# Patient Record
Sex: Female | Born: 1995 | Race: Black or African American | Hispanic: No | Marital: Single | Smoking: Current every day smoker
Health system: Southern US, Community
[De-identification: ages and names within clinical notes are randomized; demographics above are authoritative.]

## PROBLEM LIST (undated history)

## (undated) DIAGNOSIS — J45909 Unspecified asthma, uncomplicated: Secondary | ICD-10-CM

## (undated) HISTORY — PX: WISDOM TOOTH EXTRACTION: SHX21

---
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
## 2017-12-23 ENCOUNTER — Encounter (HOSPITAL_COMMUNITY): Payer: Self-pay | Admitting: Emergency Medicine

## 2017-12-23 ENCOUNTER — Ambulatory Visit (HOSPITAL_COMMUNITY)
Admission: EM | Admit: 2017-12-23 | Discharge: 2017-12-23 | Disposition: A | Discharge status: Home / Self Care | Payer: Commercial Managed Care - PPO | Attending: Family Medicine | Admitting: Family Medicine

## 2017-12-23 DIAGNOSIS — J4521 Mild intermittent asthma with (acute) exacerbation: Secondary | ICD-10-CM

## 2017-12-23 MED ORDER — IPRATROPIUM-ALBUTEROL 0.5-2.5 (3) MG/3ML IN SOLN
RESPIRATORY_TRACT | Status: AC
  Filled 2017-12-23: qty 3

## 2017-12-23 MED ORDER — IPRATROPIUM-ALBUTEROL 0.5-2.5 (3) MG/3ML IN SOLN
3.0000 mL | Freq: Once | RESPIRATORY_TRACT | Status: AC
  Administered 2017-12-23: 3 mL via RESPIRATORY_TRACT

## 2017-12-23 MED ORDER — PREDNISONE 10 MG PO TABS: 20.0000 mg | ORAL_TABLET | Freq: Two times a day (BID) | ORAL | 0 refills | Status: AC

## 2017-12-23 MED ORDER — ALBUTEROL SULFATE HFA 108 (90 BASE) MCG/ACT IN AERS: 1.0000 | INHALATION_SPRAY | Freq: Four times a day (QID) | RESPIRATORY_TRACT | 0 refills | Status: DC | PRN

## 2017-12-23 NOTE — ED Triage Notes (Signed)
Pt sts increased asthma sx over last 3 days

## 2017-12-23 NOTE — Discharge Instructions (Addendum)
Duoneb given in office Proair inhaler prescribed.  Use as directed Take steroid as prescribed and to completion Begin taking OTC antihistamine or allergy medication for allergy symptoms Follow up with PCP next week Return here or go to ER if you have any new or worsening symptoms such as shortness of breath, difficulty breathing, accessory muscle use, rib retraction, or if symptoms do not improve with medication

## 2017-12-23 NOTE — ED Provider Notes (Addendum)
[facility]    CSN: [id number] Arrival date & time: 12/23/17  1414     History   Chief Complaint Chief Complaint  Patient presents with  . Asthma    HPI Briana [name] is a 22 y.o. female with hx of asthma complaining of trouble breathing for the past 3 days.  She denies a precipitating event, but attributes it to seasonal allergies.  She has tried her albuterol inhaler without relief.  Her symptoms are made worse with cold air.  She reports similar symptoms in the past that resolved with inhaler use.    HPI  Past Medical History:  Diagnosis Date  . Asthma     There are no active problems to display for this patient.   History reviewed. No pertinent surgical history.  OB History   None      Home Medications    Prior to Admission medications   Medication Sig Start Date End Date Taking? Authorizing Provider  albuterol (PROVENTIL HFA;VENTOLIN HFA) 108 (90 Base) MCG/ACT inhaler Inhale 1-2 puffs into the lungs every 6 (six) hours as needed for wheezing or shortness of breath. 12/23/17   [name], [place], PA-C  predniSONE (DELTASONE) 10 MG tablet Take 2 tablets (20 mg total) by mouth 2 (two) times daily for 5 days. 12/23/17 12/28/17  [name], PA-C    Family History History reviewed. No pertinent family history.  Social History Social History   Tobacco Use  . Smoking status: Never Smoker  . Smokeless tobacco: Never Used  Substance Use Topics  . Alcohol use: No  . Drug use: No     Allergies   Patient has no known allergies.   Review of Systems Review of Systems  Constitutional: Negative for chills, fatigue and fever.  HENT: Positive for congestion, sneezing and sore throat. Negative for rhinorrhea, sinus pressure and sinus pain.   Respiratory: Positive for cough, shortness of breath and wheezing. Negative for stridor.   Cardiovascular: Negative for chest pain.  Gastrointestinal: Negative for abdominal pain, constipation and diarrhea.      Physical Exam Triage Vital Signs ED Triage Vitals  Enc Vitals Group     BP      Pulse      Resp      Temp      Temp src      SpO2      Weight      Height      Head Circumference      Peak Flow      Pain Score      Pain Loc      Pain Edu?      Excl. in GC?    No data found.  Updated Vital Signs BP (!) 143/93 (BP Location: Right Arm)   Pulse 85   Temp 97.8 F (36.6 C) (Oral)   Resp 18   SpO2 99%   Visual Acuity Right Eye Distance:   Left Eye Distance:   Bilateral Distance:    Right Eye Near:   Left Eye Near:    Bilateral Near:     Physical Exam  Constitutional: She is oriented to person, place, and time. She appears well-developed and well-nourished. No distress.  HENT:  Head: Normocephalic and atraumatic.  Right Ear: External ear normal.  Left Ear: External ear normal.  Nose: Nose normal.  Mouth/Throat: Oropharynx is clear and moist.  Eyes: Pupils are equal, round, and reactive to light. EOM are normal.  Neck: Neck supple.  Pulmonary/Chest: Effort  normal and breath sounds normal. No respiratory distress. She has no wheezes. She has no rales.  Lymphadenopathy:    She has no cervical adenopathy.  Neurological: She is alert and oriented to person, place, and time.  Skin: Skin is warm and dry. She is not diaphoretic.  Psychiatric: She has a normal mood and affect. Her behavior is normal. Judgment and thought content normal.     UC Treatments / Results  Labs (all labs ordered are listed, but only abnormal results are displayed) Labs Reviewed - No data to display  EKG None Radiology No results found.  Procedures Procedures (including critical care time)  Medications Ordered in UC Medications  ipratropium-albuterol (DUONEB) 0.5-2.5 (3) MG/3ML nebulizer solution 3 mL (3 mLs Nebulization Given 12/23/17 1504)     Initial Impression / Assessment and Plan / UC Course  I have reviewed the triage vital signs and the nursing notes.  Pertinent labs  & imaging results that were available during my care of the patient were reviewed by me and considered in my medical decision making (see chart for details).     Patient symptoms and physical exam findings consistent with asthma exacerbation.  Given duoneb in office.  Proair inhaler refilled.  Steroid prescribed.  Patient instructed to follow up with PCP next week.  ER precautions reviewed with patient.  Final Clinical Impressions(s) / UC Diagnoses   Final diagnoses:  Mild intermittent asthma with exacerbation    ED Discharge Orders        Ordered    predniSONE (DELTASONE) 10 MG tablet  2 times daily     12/23/17 1502    albuterol (PROVENTIL HFA;VENTOLIN HFA) 108 (90 Base) MCG/ACT inhaler  Every 6 hours PRN     12/23/17 1502       Controlled Substance Prescriptions [place] Controlled Substance Registry consulted? Not Applicable   Rennis HardingWurst, Bridgitt Raggio, PA-C 12/23/17 1511    Alvino ChapelWurst, [place], [place] JerseyPA-C 12/28/17 2024

## 2018-09-14 ENCOUNTER — Ambulatory Visit: Payer: Commercial Managed Care - PPO | Admitting: Medical

## 2018-09-14 DIAGNOSIS — Z0289 Encounter for other administrative examinations: Secondary | ICD-10-CM

## 2019-09-10 ENCOUNTER — Other Ambulatory Visit: Payer: Self-pay

## 2019-09-10 ENCOUNTER — Encounter (HOSPITAL_COMMUNITY): Payer: Self-pay | Admitting: Emergency Medicine

## 2019-09-10 ENCOUNTER — Emergency Department (HOSPITAL_COMMUNITY)
Admission: EM | Admit: 2019-09-10 | Discharge: 2019-09-10 | Disposition: A | Discharge status: Home / Self Care | Payer: Commercial Managed Care - PPO | Attending: Emergency Medicine | Admitting: Emergency Medicine

## 2019-09-10 DIAGNOSIS — M545 Low back pain: Secondary | ICD-10-CM | POA: Insufficient documentation

## 2019-09-10 DIAGNOSIS — Z5321 Procedure and treatment not carried out due to patient leaving prior to being seen by health care provider: Secondary | ICD-10-CM | POA: Insufficient documentation

## 2019-09-10 NOTE — ED Triage Notes (Signed)
Pt reports lower back pain without any injury. Pt reports taking Motrin and Bengay without relief.

## 2019-09-10 NOTE — ED Notes (Signed)
Pt reported to registration that her back is feeling better and she is going home.

## 2020-04-20 ENCOUNTER — Other Ambulatory Visit: Payer: Self-pay

## 2020-04-20 ENCOUNTER — Ambulatory Visit (HOSPITAL_COMMUNITY)
Admission: EM | Admit: 2020-04-20 | Discharge: 2020-04-20 | Disposition: A | Discharge status: Home / Self Care | Payer: Commercial Managed Care - PPO | Attending: Family Medicine | Admitting: Family Medicine

## 2020-04-20 ENCOUNTER — Encounter (HOSPITAL_COMMUNITY): Payer: Self-pay | Admitting: *Deleted

## 2020-04-20 ENCOUNTER — Encounter (HOSPITAL_COMMUNITY): Payer: Self-pay

## 2020-04-20 ENCOUNTER — Emergency Department (HOSPITAL_COMMUNITY)
Admission: EM | Admit: 2020-04-20 | Discharge: 2020-04-20 | Disposition: A | Discharge status: Home / Self Care | Payer: Commercial Managed Care - PPO | Attending: Emergency Medicine | Admitting: Emergency Medicine

## 2020-04-20 VITALS — BP 125/63 | HR 90 | Temp 98.9°F | Resp 18

## 2020-04-20 DIAGNOSIS — N611 Abscess of the breast and nipple: Secondary | ICD-10-CM | POA: Diagnosis present

## 2020-04-20 DIAGNOSIS — N644 Mastodynia: Secondary | ICD-10-CM

## 2020-04-20 DIAGNOSIS — B9689 Other specified bacterial agents as the cause of diseases classified elsewhere: Secondary | ICD-10-CM

## 2020-04-20 DIAGNOSIS — Z5321 Procedure and treatment not carried out due to patient leaving prior to being seen by health care provider: Secondary | ICD-10-CM | POA: Diagnosis not present

## 2020-04-20 DIAGNOSIS — L089 Local infection of the skin and subcutaneous tissue, unspecified: Secondary | ICD-10-CM

## 2020-04-20 MED ORDER — DOXYCYCLINE HYCLATE 100 MG PO CAPS: 100.0000 mg | ORAL_CAPSULE | Freq: Two times a day (BID) | ORAL | 0 refills | Status: DC

## 2020-04-20 NOTE — ED Triage Notes (Signed)
Pt reports having tenderness and a lump in the right breast since yesterday. Denies fever, chills. Denies discharge or swelling.

## 2020-04-20 NOTE — ED Triage Notes (Signed)
Pt reports abscess on right breast x 3 days. Denies fever. No redness or swelling noted.

## 2020-04-20 NOTE — ED Notes (Signed)
Called for room X3 with no answer 

## 2020-04-21 NOTE — ED Provider Notes (Signed)
  [facility]   [id number] 04/20/20 Arrival Time: 1859  ASSESSMENT & PLAN:  1. Bacterial skin infection     Prefers trial of antibiotic before attempting I&D.  Begin: Meds ordered this encounter  Medications  . doxycycline (VIBRAMYCIN) 100 MG capsule    Sig: Take 1 capsule (100 mg total) by mouth 2 (two) times daily.    Dispense:  20 capsule    Refill:  0     Follow-up Information    [facility].   Specialty: Urgent Care Why: If worsening or failing to improve as anticipated. Contact information: [address] [place] [address] [phone]              Reviewed expectations re: course of current medical issues. Questions answered. Outlined signs and symptoms indicating need for more acute intervention. Patient verbalized understanding. After Visit Summary given.   SUBJECTIVE:  Briana [name] is a 24 y.o. female who presents with a possible infection of R breast; noticed area of slight redness and swelling 2-3 d ago. No discharge or drainage. Afebrile. No OTC tx. Without nipple d/c.  OBJECTIVE:  Vitals:   04/20/20 1919  BP: 125/63  Pulse: 90  Resp: 18  Temp: 98.9 F (37.2 C)  TempSrc: Oral  SpO2: 100%     General appearance: alert; no distress Skin: slightly erythematous area of inferior R breast; mild skin thickening; no fluctuance; no drainage or bleeding (chaperone present for exam) Psychological: alert and cooperative; normal mood and affect  No Known Allergies  Past Medical History:  Diagnosis Date  . Asthma    Social History   Socioeconomic History  . Marital status: Single    Spouse name: Not on file  . Number of children: Not on file  . Years of education: Not on file  . Highest education level: Not on file  Occupational History  . Not on file  Tobacco Use  . Smoking status: Current Every Day Smoker    Packs/day: 0.33    Types: Cigarettes  . Smokeless tobacco: Never Used   Substance and Sexual Activity  . Alcohol use: Yes  . Drug use: No  . Sexual activity: Not on file  Other Topics Concern  . Not on file  Social History Narrative  . Not on file   Social Determinants of Health   Financial Resource Strain:   . Difficulty of Paying Living Expenses:   Food Insecurity:   . Worried About Programme researcher, broadcasting/film/video in the Last Year:   . Barista in the Last Year:   Transportation Needs:   . Freight forwarder (Medical):   Marland Kitchen Lack of Transportation (Non-Medical):   Physical Activity:   . Days of Exercise per Week:   . Minutes of Exercise per Session:   Stress:   . Feeling of Stress :   Social Connections:   . Frequency of Communication with Friends and Family:   . Frequency of Social Gatherings with Friends and Family:   . Attends Religious Services:   . Active Member of Clubs or Organizations:   . Attends Banker Meetings:   Marland Kitchen Marital Status:    History reviewed. No pertinent family history. History reviewed. No pertinent surgical history.         Mardella Layman, MD 04/21/20 [phone]

## 2020-05-23 ENCOUNTER — Emergency Department (HOSPITAL_COMMUNITY)
Admission: EM | Admit: 2020-05-23 | Discharge: 2020-05-23 | Disposition: A | Discharge status: Home / Self Care | Payer: BC Managed Care – PPO | Attending: Emergency Medicine | Admitting: Emergency Medicine

## 2020-05-23 ENCOUNTER — Emergency Department (HOSPITAL_COMMUNITY): Payer: BC Managed Care – PPO

## 2020-05-23 DIAGNOSIS — Z79899 Other long term (current) drug therapy: Secondary | ICD-10-CM | POA: Diagnosis not present

## 2020-05-23 DIAGNOSIS — J45909 Unspecified asthma, uncomplicated: Secondary | ICD-10-CM | POA: Diagnosis not present

## 2020-05-23 DIAGNOSIS — R079 Chest pain, unspecified: Secondary | ICD-10-CM

## 2020-05-23 DIAGNOSIS — F17210 Nicotine dependence, cigarettes, uncomplicated: Secondary | ICD-10-CM | POA: Insufficient documentation

## 2020-05-23 DIAGNOSIS — R0789 Other chest pain: Secondary | ICD-10-CM | POA: Diagnosis not present

## 2020-05-23 LAB — BASIC METABOLIC PANEL
Anion gap: 11 (ref 5–15)
BUN: 8 mg/dL (ref 6–20)
CO2: 23 mmol/L (ref 22–32)
Calcium: 9.9 mg/dL (ref 8.9–10.3)
Chloride: 104 mmol/L (ref 98–111)
Creatinine, Ser: 0.79 mg/dL (ref 0.44–1.00)
GFR calc Af Amer: >60 mL/min (ref >60)
GFR calc non Af Amer: >60 mL/min (ref >60)
Glucose, Bld: 90 mg/dL (ref 70–99)
Potassium: 3.5 mmol/L (ref 3.5–5.1)
Sodium: 138 mmol/L (ref 135–145)

## 2020-05-23 LAB — CBC
HCT: 42.4 % (ref 36.0–46.0)
Hemoglobin: 13.8 g/dL (ref 12.0–15.0)
MCH: 28.9 pg (ref 26.0–34.0)
MCHC: 32.5 g/dL (ref 30.0–36.0)
MCV: 88.7 fL (ref 80.0–100.0)
Platelets: 367 10*3/uL (ref 150–400)
RBC: 4.78 MIL/uL (ref 3.87–5.11)
RDW: 12.2 % (ref 11.5–15.5)
WBC: 5.5 10*3/uL (ref 4.0–10.5)
nRBC: 0 % (ref 0.0–0.2)

## 2020-05-23 LAB — I-STAT BETA HCG BLOOD, ED (MC, WL, AP ONLY): I-stat hCG, quantitative: <5 m[IU]/mL (ref <5)

## 2020-05-23 LAB — TROPONIN I (HIGH SENSITIVITY)
Troponin I (High Sensitivity): 4 ng/L (ref <18)
Troponin I (High Sensitivity): 3 ng/L (ref <18)

## 2020-05-23 MED ORDER — ALBUTEROL SULFATE HFA 108 (90 BASE) MCG/ACT IN AERS: 1.0000 | INHALATION_SPRAY | Freq: Four times a day (QID) | RESPIRATORY_TRACT | 0 refills | Status: AC | PRN

## 2020-05-23 MED ORDER — NAPROXEN 375 MG PO TABS: 375.0000 mg | ORAL_TABLET | Freq: Two times a day (BID) | ORAL | 0 refills | Status: DC

## 2020-05-23 NOTE — ED Provider Notes (Signed)
[facility] EMERGENCY DEPARTMENT Provider Note   CSN: [id number] Arrival date & time: 05/23/20  1251     History Chief Complaint  Patient presents with  . Chest Pain    Briana [name] is a 24 y.o. female.  HPI  HPI: A 24 year old patient with a history of obesity presents for evaluation of chest pain. Initial onset of pain was approximately 3-6 hours ago. The patient's chest pain is sharp and is not worse with exertion. The patient complains of nausea. The patient's chest pain is middle- or left-sided, is not well-localized, is not described as heaviness/pressure/tightness and does not radiate to the arms/jaw/neck. The patient denies diaphoresis. The patient has smoked in the past 90 days. The patient has no history of stroke, has no history of peripheral artery disease, denies any history of treated diabetes, has no relevant family history of coronary artery disease (first degree relative at less than age 57), is not hypertensive and has no history of hypercholesterolemia.  Patient states the pain was sharp.  It went to her back and shoulder.  Improved after treatment by EMS which included morphine and nitroglycerin.  Patient was also given aspirin.  Patient denies any history of heart disease.  She denies any abdominal pain with it.  Her symptoms have decreased significantly Past Medical History:  Diagnosis Date  . Asthma     There are no problems to display for this patient.   No past surgical history on file.   OB History   No obstetric history on file.     No family history on file.  Social History   Tobacco Use  . Smoking status: Current Every Day Smoker    Packs/day: 0.33    Types: Cigarettes  . Smokeless tobacco: Never Used  Substance Use Topics  . Alcohol use: Yes  . Drug use: No    Home Medications Prior to Admission medications   Medication Sig Start Date End Date Taking? Authorizing Provider  albuterol (PROVENTIL HFA;VENTOLIN HFA)  108 (90 Base) MCG/ACT inhaler Inhale 1-2 puffs into the lungs every 6 (six) hours as needed for wheezing or shortness of breath. 12/23/17   Wurst, [place], PA-C  doxycycline (VIBRAMYCIN) 100 MG capsule Take 1 capsule (100 mg total) by mouth 2 (two) times daily. 04/20/20   Mardella Layman, MD  etonogestrel (NEXPLANON) 68 MG IMPL implant 1 each by Subdermal route once.    [provider]  naproxen (NAPROSYN) 375 MG tablet Take 1 tablet (375 mg total) by mouth 2 (two) times daily. 05/23/20   Linwood Dibbles, MD    Allergies    Patient has no known allergies.  Review of Systems   Review of Systems  All other systems reviewed and are negative.   Physical Exam Updated Vital Signs BP (!) 146/78 (BP Location: Right Arm)   Pulse 88   Temp 99.5 F (37.5 C) (Oral)   Resp (!) 24   Ht 1.727 m (5\' 8" )   Wt 129.3 kg   SpO2 100%   BMI 43.33 kg/m   Physical Exam Vitals and nursing note reviewed.  Constitutional:      General: She is not in acute distress.    Appearance: She is well-developed.  HENT:     Head: Normocephalic and atraumatic.     Right Ear: External ear normal.     Left Ear: External ear normal.  Eyes:     General: No scleral icterus.       Right eye: No  discharge.        Left eye: No discharge.     Conjunctiva/sclera: Conjunctivae normal.  Neck:     Trachea: No tracheal deviation.  Cardiovascular:     Rate and Rhythm: Normal rate and regular rhythm.  Pulmonary:     Effort: Pulmonary effort is normal. No respiratory distress.     Breath sounds: Normal breath sounds. No stridor. No wheezing or rales.  Abdominal:     General: Bowel sounds are normal. There is no distension.     Palpations: Abdomen is soft.     Tenderness: There is no abdominal tenderness. There is no guarding or rebound.  Musculoskeletal:        General: No tenderness.     Cervical back: Neck supple.  Skin:    General: Skin is warm and dry.     Findings: No rash.  Neurological:     Mental Status:  She is alert.     Cranial Nerves: No cranial nerve deficit (no facial droop, extraocular movements intact, no slurred speech).     Sensory: No sensory deficit.     Motor: No abnormal muscle tone or seizure activity.     Coordination: Coordination normal.     ED Results / Procedures / Treatments   Labs (all labs ordered are listed, but only abnormal results are displayed) Labs Reviewed  BASIC METABOLIC PANEL  CBC  I-STAT BETA HCG BLOOD, ED ([facility], [facility], [facility] ONLY)  TROPONIN I (HIGH SENSITIVITY)  TROPONIN I (HIGH SENSITIVITY)    EKG EKG Interpretation  Date/Time:  Wednesday May 23 2020 12:52:23 EDT Ventricular Rate:  73 PR Interval:  148 QRS Duration: 88 QT Interval:  390 QTC Calculation: 429 R Axis:   41 Text Interpretation: Normal sinus rhythm with sinus arrhythmia Normal ECG No old tracing to compare Confirmed by Linwood Dibbles [phone]) on 05/23/2020 5:21:13 PM   Radiology DG Chest 2 View  Result Date: 05/23/2020 CLINICAL DATA:  Chest pain. Additional history provided: History of asthma. EXAM: CHEST - 2 VIEW COMPARISON:  Report from chest radiographs 11/20/2000 (images unavailable). FINDINGS: Heart size within normal limits. There is no appreciable airspace consolidation. No evidence of pleural effusion or pneumothorax. No acute bony abnormality identified. IMPRESSION: No evidence of active cardiopulmonary disease. Electronically Signed   By: Jackey Loge DO   On: 05/23/2020 13:26    Procedures Procedures (including critical care time)  Medications Ordered in ED Medications - No data to display  ED Course  I have reviewed the triage vital signs and the nursing notes.  Pertinent labs & imaging results that were available during my care of the patient were reviewed by me and considered in my medical decision making (see chart for details).  Clinical Course as of May 23 1842  Wed May 23, 2020  1720 Laboratory tests reviewed.  CBC and metabolic panel are normal.  Pregnancy  test is negative.  First troponin is normal.   [JK]  1832 Deltra troponin normal   [JK]    Clinical Course User Index [JK] Linwood Dibbles, MD   MDM Rules/Calculators/A&P HEAR Score: 1                        Patient presented to ED for evaluation of chest pain.  Patient had sudden onset work.  No prodromal symptoms.  Chest x-ray does not show pneumothorax or pneumonia.  Patient is low risk for PE.  PERC negative.  Symptoms atypical for ACS.  Low  risk heart score with serial troponins are normal.  Doubt acute coronary syndrome.  Unclear etiology.  Could possibly be pleurisy versus esophageal spasm.  Will discharge home with NSAIDs.  Warning signs precautions discussed Final Clinical Impression(s) / ED Diagnoses Final diagnoses:  Chest pain, unspecified type    Rx / DC Orders ED Discharge Orders         Ordered    naproxen (NAPROSYN) 375 MG tablet  2 times daily        05/23/20 1841           Linwood Dibbles, MD 05/23/20 1843

## 2020-05-23 NOTE — Discharge Instructions (Addendum)
Follow up with a primary care doctor if your symptoms do not resolve in the next week.  Return to the ED for high fevers, worsening symptoms

## 2020-05-23 NOTE — ED Triage Notes (Signed)
Pt reports central chest pain with radiation to back and stomach onset this morning onset while on her break at work. Pt given 324 ASA, 2 nitro, and 6 mg morphine by EMS with some relief in pain, although pt still tearful on arrival to ED. Endorses shob. Hx asthma.

## 2020-05-25 ENCOUNTER — Encounter: Payer: Self-pay | Admitting: Pulmonary Disease

## 2020-05-25 ENCOUNTER — Ambulatory Visit (INDEPENDENT_AMBULATORY_CARE_PROVIDER_SITE_OTHER): Payer: BC Managed Care – PPO | Admitting: Pulmonary Disease

## 2020-05-25 ENCOUNTER — Other Ambulatory Visit: Payer: Self-pay

## 2020-05-25 ENCOUNTER — Telehealth: Payer: Self-pay | Admitting: Pulmonary Disease

## 2020-05-25 VITALS — BP 146/90 | HR 85 | Temp 97.3°F | Ht 68.0 in | Wt 289.2 lb

## 2020-05-25 DIAGNOSIS — J4541 Moderate persistent asthma with (acute) exacerbation: Secondary | ICD-10-CM | POA: Diagnosis not present

## 2020-05-25 MED ORDER — ALBUTEROL SULFATE (2.5 MG/3ML) 0.083% IN NEBU: 2.5000 mg | INHALATION_SOLUTION | Freq: Four times a day (QID) | RESPIRATORY_TRACT | 5 refills | Status: AC | PRN

## 2020-05-25 MED ORDER — FLOVENT HFA 110 MCG/ACT IN AERO: 2.0000 | INHALATION_SPRAY | Freq: Two times a day (BID) | RESPIRATORY_TRACT | 12 refills | Status: DC

## 2020-05-25 NOTE — Telephone Encounter (Signed)
Called and spoke with patient she states that when she called she was unaware that order for nebulizer was sent to DME company. She states that they called her about an hour ago and she has already picked up nebulizer machine. Nothing further needed at this time.

## 2020-05-25 NOTE — Addendum Note (Signed)
Addended by: Melonie [place] on: 05/25/2020 10:01 AM   Modules accepted: Orders

## 2020-05-25 NOTE — Patient Instructions (Signed)
Flovent two puffs in the morning and two puffs in the evening, and rinse mouth after each use  Albuterol every 4 to 6 hours as needed for cough, wheezing, chest congestion or tightness, and shortness of breath  Will arrange for pulmonary function test  Follow up in 8 weeks

## 2020-05-25 NOTE — Progress Notes (Signed)
[facility]  Chief Complaint  Patient presents with  . Consult    was having chest pain which constricted breathing on wednesday    Constitutional:  BP (!) 146/90 (BP Location: Left Arm, Cuff Size: Normal)   Pulse 85   Temp (!) 97.3 F (36.3 C) (Other (Comment)) Comment (Src): wrist  Ht 5\' 8"  (1.727 m)   Wt 289 lb 3.2 oz (131.2 kg)   SpO2 98% Comment: Room air  BMI 43.97 kg/m   Past Medical History:  Asthma  Past Surgical History:  Her  has a past surgical history that includes Wisdom tooth extraction.  Brief Summary:  Briana [name] is a 24 y.o. female smoker with asthma.      Subjective:  She was seen in ER on 05/23/20 for chest pain.  Chest xray from 05/23/20 was normal.  Labs from 05/23/20: na 138, K 3.5, CO2 23, Creatinine 0.79, Ca 9.9, WBC 5.5, Hb 13.8, PLT 367.  She was prescribed naproxen.  She was diagnosed with asthma as a child.  She has several relatives that have asthma.  Her symptoms happen more often during the Summer and Spring.   She gets episodes of chest tightness and wheezing.  Sometimes has a cough.  Doesn't usually have sputum.  Not having sinus congestion or eye irritation.  No eczema.  She has burning in her mouth when she eats cinnamon or raisins.  No other food/medication allergies.  She Vaps.  She works as a 01-04-1967.  Never had allergy testing before.  She was on a steroid inhaler before and this helped.    Physical Exam:   Appearance - well kempt   ENMT - no sinus tenderness, no oral exudate, no LAN, Mallampati 2 airway, no stridor  Respiratory - equal breath sounds bilaterally, no wheezing or rales  CV - s1s2 regular rate and rhythm, no murmurs  Ext - no clubbing, no edema  Skin - no rashes  Psych - normal mood and affect   Pulmonary testing:    Chest Imaging:    Social History:  She  reports that she has been smoking cigarettes. She has a 0.66 pack-year smoking history. She has  never used smokeless tobacco. She reports current alcohol use. She reports that she does not use drugs.  Family History:  Her family history includes Asthma in her father and mother.     Assessment/Plan:   Moderate, persistent asthma. - will have her try flovent - will arrange for home nebulizer - albuterol prn - will arrange for pulmonary function test  Vaping. - advised her to quit  Time Spent Involved in Patient Care on Day of Examination:  32 minutes  Follow up:  Patient Instructions  Flovent two puffs in the morning and two puffs in the evening, and rinse mouth after each use  Albuterol every 4 to 6 hours as needed for cough, wheezing, chest congestion or tightness, and shortness of breath  Will arrange for pulmonary function test  Follow up in 8 weeks   Medication List:   Allergies as of 05/25/2020   No Known Allergies     Medication List       Accurate as of May 25, 2020  9:39 AM. If you have any questions, ask your nurse or doctor.        STOP taking these medications   doxycycline 100 MG capsule Commonly known as: VIBRAMYCIN Stopped by: May 27, 2020, MD     TAKE  these medications   albuterol 108 (90 Base) MCG/ACT inhaler Commonly known as: VENTOLIN HFA Inhale 1-2 puffs into the lungs every 6 (six) hours as needed for wheezing or shortness of breath. What changed: Another medication with the same name was added. Make sure you understand how and when to take each. Changed by: Coralyn Helling, MD   albuterol (2.5 MG/3ML) 0.083% nebulizer solution Commonly known as: PROVENTIL Take 3 mLs (2.5 mg total) by nebulization every 6 (six) hours as needed for wheezing or shortness of breath. What changed: You were already taking a medication with the same name, and this prescription was added. Make sure you understand how and when to take each. Changed by: Coralyn Helling, MD   Flovent HFA 110 MCG/ACT inhaler Generic drug: fluticasone Inhale 2 puffs into the  lungs in the morning and at bedtime. Started by: Coralyn Helling, MD   naproxen 375 MG tablet Commonly known as: NAPROSYN Take 1 tablet (375 mg total) by mouth 2 (two) times daily.   Nexplanon 68 MG Impl implant Generic drug: etonogestrel 1 each by Subdermal route once.       Signature:  Coralyn Helling, MD [facility] Pulmonary/Critical Care Pager - [phone] 05/25/2020, 9:39 AM

## 2020-07-27 ENCOUNTER — Other Ambulatory Visit: Payer: Self-pay

## 2020-07-27 ENCOUNTER — Ambulatory Visit (INDEPENDENT_AMBULATORY_CARE_PROVIDER_SITE_OTHER): Payer: BC Managed Care – PPO | Admitting: Pulmonary Disease

## 2020-07-27 ENCOUNTER — Encounter: Payer: Self-pay | Admitting: Pulmonary Disease

## 2020-07-27 VITALS — BP 136/64 | HR 95 | Temp 97.6°F | Ht 68.0 in | Wt 302.0 lb

## 2020-07-27 DIAGNOSIS — J4541 Moderate persistent asthma with (acute) exacerbation: Secondary | ICD-10-CM

## 2020-07-27 DIAGNOSIS — J301 Allergic rhinitis due to pollen: Secondary | ICD-10-CM

## 2020-07-27 LAB — PULMONARY FUNCTION TEST
DL/VA % pred: 109 %
DL/VA: 5.05 ml/min/mmHg/L
DLCO cor % pred: 104 %
DLCO cor: 26.35 ml/min/mmHg
DLCO unc % pred: 104 %
DLCO unc: 26.35 ml/min/mmHg
FEF 25-75 Post: 4.69 L/sec
FEF 25-75 Pre: 4.58 L/sec
FEF2575-%Change-Post: 2 %
FEF2575-%Pred-Post: 123 %
FEF2575-%Pred-Pre: 120 %
FEV1-%Change-Post: 2 %
FEV1-%Pred-Post: 115 %
FEV1-%Pred-Pre: 112 %
FEV1-Post: 3.76 L
FEV1-Pre: 3.66 L
FEV1FVC-%Change-Post: 5 %
FEV1FVC-%Pred-Pre: 97 %
FEV6-%Change-Post: 0 %
FEV6-%Pred-Post: 111 %
FEV6-%Pred-Pre: 111 %
FEV6-Post: 4.17 L
FEV6-Pre: 4.17 L
FEV6FVC-%Pred-Post: 100 %
FEV6FVC-%Pred-Pre: 100 %
FVC-%Change-Post: -2 %
FVC-%Pred-Post: 111 %
FVC-%Pred-Pre: 114 %
FVC-Post: 4.17 L
FVC-Pre: 4.29 L
Post FEV1/FVC ratio: 90 %
Post FEV6/FVC ratio: 100 %
Pre FEV1/FVC ratio: 85 %
Pre FEV6/FVC Ratio: 100 %
RV % pred: 53 %
RV: 0.76 L
TLC % pred: 89 %
TLC: 5.07 L

## 2020-07-27 MED ORDER — BUDESONIDE-FORMOTEROL FUMARATE 160-4.5 MCG/ACT IN AERO: 2.0000 | INHALATION_SPRAY | Freq: Two times a day (BID) | RESPIRATORY_TRACT | 6 refills | Status: AC

## 2020-07-27 MED ORDER — FLUTICASONE PROPIONATE 50 MCG/ACT NA SUSP: 1.0000 | Freq: Every day | NASAL | 2 refills | Status: AC

## 2020-07-27 NOTE — Progress Notes (Signed)
[facility]  Chief Complaint  Patient presents with  . Follow-up    PFT review results, still having sob, cough, mostly non productive    Constitutional:  BP 136/64 (BP Location: Right Arm, Cuff Size: Large)   Pulse 95   Temp 97.6 F (36.4 C) (Skin)   Ht 5\' 8"  (1.727 m)   Wt (!) 302 lb (137 kg)   SpO2 96%   BMI 45.92 kg/m   Past Medical History:  Asthma  Past Surgical History:  Her  has a past surgical history that includes Wisdom tooth extraction.  Brief Summary:  Briana [name] is a 24 y.o. female smoker with allergic asthma and rhinitis.  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      Subjective:   She had PFT today and this was normal.  She feels flovent helped some, but still having symptoms.  Getting sinus and ear congestion with post nasal drip.  Gets coughing and wheezing spells.  Needs to use albuterol 4 or 5 times per week.  Still vaping at night after work.   Physical Exam:   Appearance - well kempt   ENMT - no sinus tenderness, no oral exudate, no LAN, Mallampati 2 airway, no stridor, prominent nasal turbinates, boggy nasal mucosa, clear nasal drainage  Respiratory - equal breath sounds bilaterally, no wheezing or rales  CV - s1s2 regular rate and rhythm, no murmurs  Ext - no clubbing, no edema  Skin - no rashes  Psych - normal mood and affect    Pulmonary testing:   PFT 07/27/20 >> FEV1 3.76 (115%), FEV1% 90, TLC 5.07 (89%), DLCO 104%  Chest Imaging:    Social History:  She  reports that she has been smoking cigarettes. She has a 0.66 pack-year smoking history. She has never used smokeless tobacco. She reports current alcohol use. She reports that she does not use drugs.  Family History:  Her family history includes Asthma in her father and mother.     Assessment/Plan:   Allergic asthma. - still having symptoms - reviewed her PFT results with her - will change her to symbicort in place of flovent -  continue prn albuterol - will arrange for spacer device for her inhalers - if symptoms persist, then might need to assess for biologic agent  Allergic rhinitis. - will have her use nasal irrigation and flonase  Vaping. - advised her to quit completely  Time Spent Involved in Patient Care on Day of Examination:  32 minutes  Follow up:  Patient Instructions  Stop flovent  Symbicort two puffs twice per day, and rinse mouth after each use  Use saline nasal rinse daily  Flonase 1 spray in each nostril daily  Will arrange for spacer device to use with your inhalers  Follow up in 2 months   Medication List:   Allergies as of 07/27/2020   No Known Allergies     Medication List       Accurate as of July 27, 2020 10:49 AM. If you have any questions, ask your nurse or doctor.        STOP taking these medications   Flovent HFA 110 MCG/ACT inhaler Generic drug: fluticasone Stopped by: July 29, 2020, MD     TAKE these medications   albuterol 108 (90 Base) MCG/ACT inhaler Commonly known as: VENTOLIN HFA Inhale 1-2 puffs into the lungs every 6 (six) hours as needed for wheezing or shortness of breath.   albuterol (2.5 MG/3ML)  0.083% nebulizer solution Commonly known as: PROVENTIL Take 3 mLs (2.5 mg total) by nebulization every 6 (six) hours as needed for wheezing or shortness of breath.   budesonide-formoterol 160-4.5 MCG/ACT inhaler Commonly known as: SYMBICORT Inhale 2 puffs into the lungs 2 (two) times daily. Started by: Coralyn Helling, MD   fluticasone 50 MCG/ACT nasal spray Commonly known as: FLONASE Place 1 spray into both nostrils daily. Started by: Coralyn Helling, MD   naproxen 375 MG tablet Commonly known as: NAPROSYN Take 1 tablet (375 mg total) by mouth 2 (two) times daily.   Nexplanon 68 MG Impl implant Generic drug: etonogestrel 1 each by Subdermal route once.       Signature:  Coralyn Helling, MD [facility] Pulmonary/Critical Care Pager - [phone] 07/27/2020, 10:49 AM

## 2020-07-27 NOTE — Patient Instructions (Signed)
Stop flovent  Symbicort two puffs twice per day, and rinse mouth after each use  Use saline nasal rinse daily  Flonase 1 spray in each nostril daily  Will arrange for spacer device to use with your inhalers  Follow up in 2 months

## 2020-07-27 NOTE — Progress Notes (Signed)
Full PFT performed today. °

## 2020-08-22 ENCOUNTER — Other Ambulatory Visit: Payer: Self-pay

## 2020-08-22 ENCOUNTER — Encounter (HOSPITAL_COMMUNITY): Payer: Self-pay

## 2020-08-22 ENCOUNTER — Ambulatory Visit (HOSPITAL_COMMUNITY)
Admission: RE | Admit: 2020-08-22 | Discharge: 2020-08-22 | Disposition: A | Discharge status: Home / Self Care | Payer: BC Managed Care – PPO | Source: Ambulatory Visit | Attending: Family Medicine | Admitting: Family Medicine

## 2020-08-22 VITALS — BP 125/78 | HR 86 | Temp 98.1°F | Resp 18

## 2020-08-22 DIAGNOSIS — M545 Low back pain, unspecified: Secondary | ICD-10-CM | POA: Diagnosis not present

## 2020-08-22 MED ORDER — DICLOFENAC SODIUM 75 MG PO TBEC: 75.0000 mg | DELAYED_RELEASE_TABLET | Freq: Two times a day (BID) | ORAL | 0 refills | Status: AC

## 2020-08-22 MED ORDER — KETOROLAC TROMETHAMINE 60 MG/2ML IM SOLN
60.0000 mg | Freq: Once | INTRAMUSCULAR | Status: AC
  Administered 2020-08-22: 10:00:00 60 mg via INTRAMUSCULAR

## 2020-08-22 MED ORDER — KETOROLAC TROMETHAMINE 60 MG/2ML IM SOLN
INTRAMUSCULAR | Status: AC
  Filled 2020-08-22: qty 2

## 2020-08-22 MED ORDER — CYCLOBENZAPRINE HCL 10 MG PO TABS: ORAL_TABLET | ORAL | 0 refills | Status: AC

## 2020-08-22 NOTE — ED Provider Notes (Signed)
[facility]   [id number] 08/22/20 Arrival Time: 0840  ASSESSMENT & PLAN:  1. Acute bilateral low back pain without sciatica     Able to ambulate here and hemodynamically stable. No indication for imaging of back at this time given no trauma and normal neurological exam. Discussed.  Meds ordered this encounter  Medications  . ketorolac (TORADOL) injection 60 mg  . cyclobenzaprine (FLEXERIL) 10 MG tablet    Sig: Take 1 tablet by mouth 3 times daily as needed for muscle spasm. Warning: May cause drowsiness.    Dispense:  21 tablet    Refill:  0  . diclofenac (VOLTAREN) 75 MG EC tablet    Sig: Take 1 tablet (75 mg total) by mouth 2 (two) times daily.    Dispense:  14 tablet    Refill:  0    Medication sedation precautions given. Encourage ROM/movement as tolerated.   Reviewed expectations re: course of current medical issues. Questions answered. Outlined signs and symptoms indicating need for more acute intervention. Patient verbalized understanding. After Visit Summary given.   SUBJECTIVE: History from: patient.  Briana [name] is a 24 y.o. female who presents with complaint of fairly persistent bilateral lower back discomfort. Onset gradual. First noted approx 3 d ago. Injury/trama: no. History of back problems requiring medical care: occasional. Pain described as aching and sharp and without radiation. Aggravating factors: certain movements and prolonged walking/standing. Alleviating factors: have not been identified. Progressive LE weakness or saddle anesthesia: none. Extremity sensation changes or weakness: none. Ambulatory without difficulty. Normal bowel/bladder habits: yes; without urinary retention. Normal PO intake without n/v. No associated abdominal pain/n/v.  Reports no chronic steroid use, fevers, IV drug use, or recent back surgeries or procedures.   OBJECTIVE:  Vitals:   08/22/20 0908  BP: 125/78  Pulse: 86  Resp: 18  Temp: 98.1 F (36.7  C)  TempSrc: Oral  SpO2: 97%    General appearance: alert; no distress HEENT: [place]; AT Neck: supple with FROM; without midline tenderness CV: regular Lungs: unlabored respirations; speaks full sentences without difficulty Abdomen: soft, non-tender; non-distended Back: moderate and poorly localized tenderness to palpation over lower back paraspinal musculature; FROM at waist; bruising: none; without midline tenderness Extremities: without edema; symmetrical without gross deformities; normal ROM of bilateral LE Skin: warm and dry Neurologic: normal gait; normal sensation and strength of bilateral LE Psychological: alert and cooperative; normal mood and affect  No Known Allergies  Past Medical History:  Diagnosis Date  . Asthma    Social History   Socioeconomic History  . Marital status: Single    Spouse name: Not on file  . Number of children: Not on file  . Years of education: Not on file  . Highest education level: Not on file  Occupational History  . Not on file  Tobacco Use  . Smoking status: Current Every Day Smoker    Packs/day: 0.33    Years: 2.00    Pack years: 0.66    Types: Cigarettes  . Smokeless tobacco: Never Used  . Tobacco comment: Vaping, sometimes smokes cigarettes  Vaping Use  . Vaping Use: Every day  Substance and Sexual Activity  . Alcohol use: Yes  . Drug use: No  . Sexual activity: Not on file  Other Topics Concern  . Not on file  Social History Narrative  . Not on file   Social Determinants of Health   Financial Resource Strain: Not on file  Food Insecurity: Not on file  Transportation  Needs: Not on file  Physical Activity: Not on file  Stress: Not on file  Social Connections: Not on file  Intimate Partner Violence: Not on file   Family History  Problem Relation Age of Onset  . Asthma Mother   . Asthma Father    Past Surgical History:  Procedure Laterality Date  . WISDOM TOOTH EXTRACTION       Mardella Layman, MD 08/22/20  1241

## 2020-08-22 NOTE — ED Triage Notes (Signed)
Pt presents with recurrent lower back pain X 3 days

## 2020-08-22 NOTE — Discharge Instructions (Signed)

## 2020-09-17 DIAGNOSIS — U071 COVID-19: Secondary | ICD-10-CM

## 2020-09-17 HISTORY — DX: COVID-19: U07.1

## 2020-10-01 ENCOUNTER — Encounter: Payer: Self-pay | Admitting: Pulmonary Disease

## 2020-10-01 ENCOUNTER — Ambulatory Visit: Payer: BC Managed Care – PPO | Admitting: Pulmonary Disease

## 2020-10-01 ENCOUNTER — Other Ambulatory Visit: Payer: Self-pay

## 2020-10-01 VITALS — BP 116/68 | HR 73 | Temp 97.0°F | Ht 68.0 in | Wt 302.6 lb

## 2020-10-01 DIAGNOSIS — J301 Allergic rhinitis due to pollen: Secondary | ICD-10-CM | POA: Diagnosis not present

## 2020-10-01 DIAGNOSIS — Z8616 Personal history of COVID-19: Secondary | ICD-10-CM | POA: Diagnosis not present

## 2020-10-01 DIAGNOSIS — J4541 Moderate persistent asthma with (acute) exacerbation: Secondary | ICD-10-CM | POA: Diagnosis not present

## 2020-10-01 DIAGNOSIS — Z23 Encounter for immunization: Secondary | ICD-10-CM | POA: Diagnosis not present

## 2020-10-01 NOTE — Patient Instructions (Signed)
Flu shot today Follow up in 6 months 

## 2020-10-01 NOTE — Progress Notes (Signed)
Briana [name], Briana [name], Briana [name]  Chief Complaint  Patient presents with  . Follow-up    Little bit of chest congestion since had COVID about 2 weeks ago    Constitutional:  BP 116/68 (BP Location: Left Arm, Cuff Size: Large)   Pulse 73   Temp (!) 97 F (36.1 C) (Other (Comment)) Comment (Src): wrist  Ht 5\' 8"  (1.727 m)   Wt (!) 302 lb 9.6 oz (137.3 kg)   SpO2 100% Comment: Room air  BMI 46.01 kg/m   Past Medical History:  Asthma, COVID 26 September 2020  Past Surgical History:  Her  has a past surgical history that includes Wisdom tooth extraction.  Brief Summary:  Briana [name] is a 25 y.o. female smoker with allergic asthma Briana rhinitis.  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      Subjective:   She feels that using flonase Briana symbicort helped.  Sinuses better Briana wasn't needing to use albuterol as much.  She tested positive for COVID on 09/17/20.  Was having fever, cough, dyspnea, Briana myalgia.  All these have improved.  Still has some mild fatigue.   Physical Exam:   Appearance - well kempt   ENMT - no sinus tenderness, no oral exudate, no LAN, Mallampati 2 airway, no stridor  Respiratory - equal breath sounds bilaterally, no wheezing or rales  CV - s1s2 regular rate Briana rhythm, no murmurs  Ext - no clubbing, no edema  Skin - no rashes  Psych - normal mood Briana affect    [name] testing:   PFT 07/27/20 >> FEV1 3.76 (115%), FEV1% 90, TLC 5.07 (89%), DLCO 104%  Chest Imaging:    Social History:  She  reports that she has been smoking cigarettes. She has a 0.66 pack-year smoking history. She has never used smokeless tobacco. She reports current alcohol use. She reports that she does not use drugs.  Family History:  Her family history includes Asthma in her father Briana mother.     Assessment/Plan:   Allergic asthma. - continue symbicort with prn albuterol - she has a spacer device - influenza vaccine today  Allergic rhinitis. -  continue nasal irrigation Briana flonase  COVID 19 infection. - clinically improved - advised her to arrange for COVID 19 booster when she can  Vaping. - she is gradually decreasing how much she vaps  Time Spent Involved in Patient [name] on Day of Examination:  22 minutes  Follow up:  Patient Instructions  Flu shot today  Follow up in 6 months   Medication List:   Allergies as of 10/01/2020   No Known Allergies     Medication List       Accurate as of October 01, 2020  9:26 AM. If you have any questions, ask your nurse or doctor.        albuterol 108 (90 Base) MCG/ACT inhaler Commonly known as: VENTOLIN HFA Inhale 1-2 puffs into the lungs every 6 (six) hours as needed for wheezing or shortness of breath.   albuterol (2.5 MG/3ML) 0.083% nebulizer solution Commonly known as: PROVENTIL Take 3 mLs (2.5 mg total) by nebulization every 6 (six) hours as needed for wheezing or shortness of breath.   budesonide-formoterol 160-4.5 MCG/ACT inhaler Commonly known as: SYMBICORT Inhale 2 puffs into the lungs 2 (two) times daily.   cyclobenzaprine 10 MG tablet Commonly known as: FLEXERIL Take 1 tablet by mouth 3 times daily as needed for muscle spasm. Warning: May cause drowsiness.  diclofenac 75 MG EC tablet Commonly known as: VOLTAREN Take 1 tablet (75 mg total) by mouth 2 (two) times daily.   fluticasone 50 MCG/ACT nasal spray Commonly known as: FLONASE Place 1 spray into both nostrils daily.   Nexplanon 68 MG Impl implant Generic drug: etonogestrel 1 each by Subdermal route once.       Signature:  Coralyn Helling, MD [facility] [name]/Briana [name] Pager - [phone] 10/01/2020, 9:26 AM

## 2021-01-14 IMAGING — DX DG CHEST 2V
2 series · 2 of 2 positions shown · non-contrast
Comparison: Report from chest radiographs 11/20/2000 (images
unavailable).

CLINICAL DATA: Chest pain. Additional history provided: History of
asthma.

EXAM:
CHEST - 2 VIEW

[w chest pa]
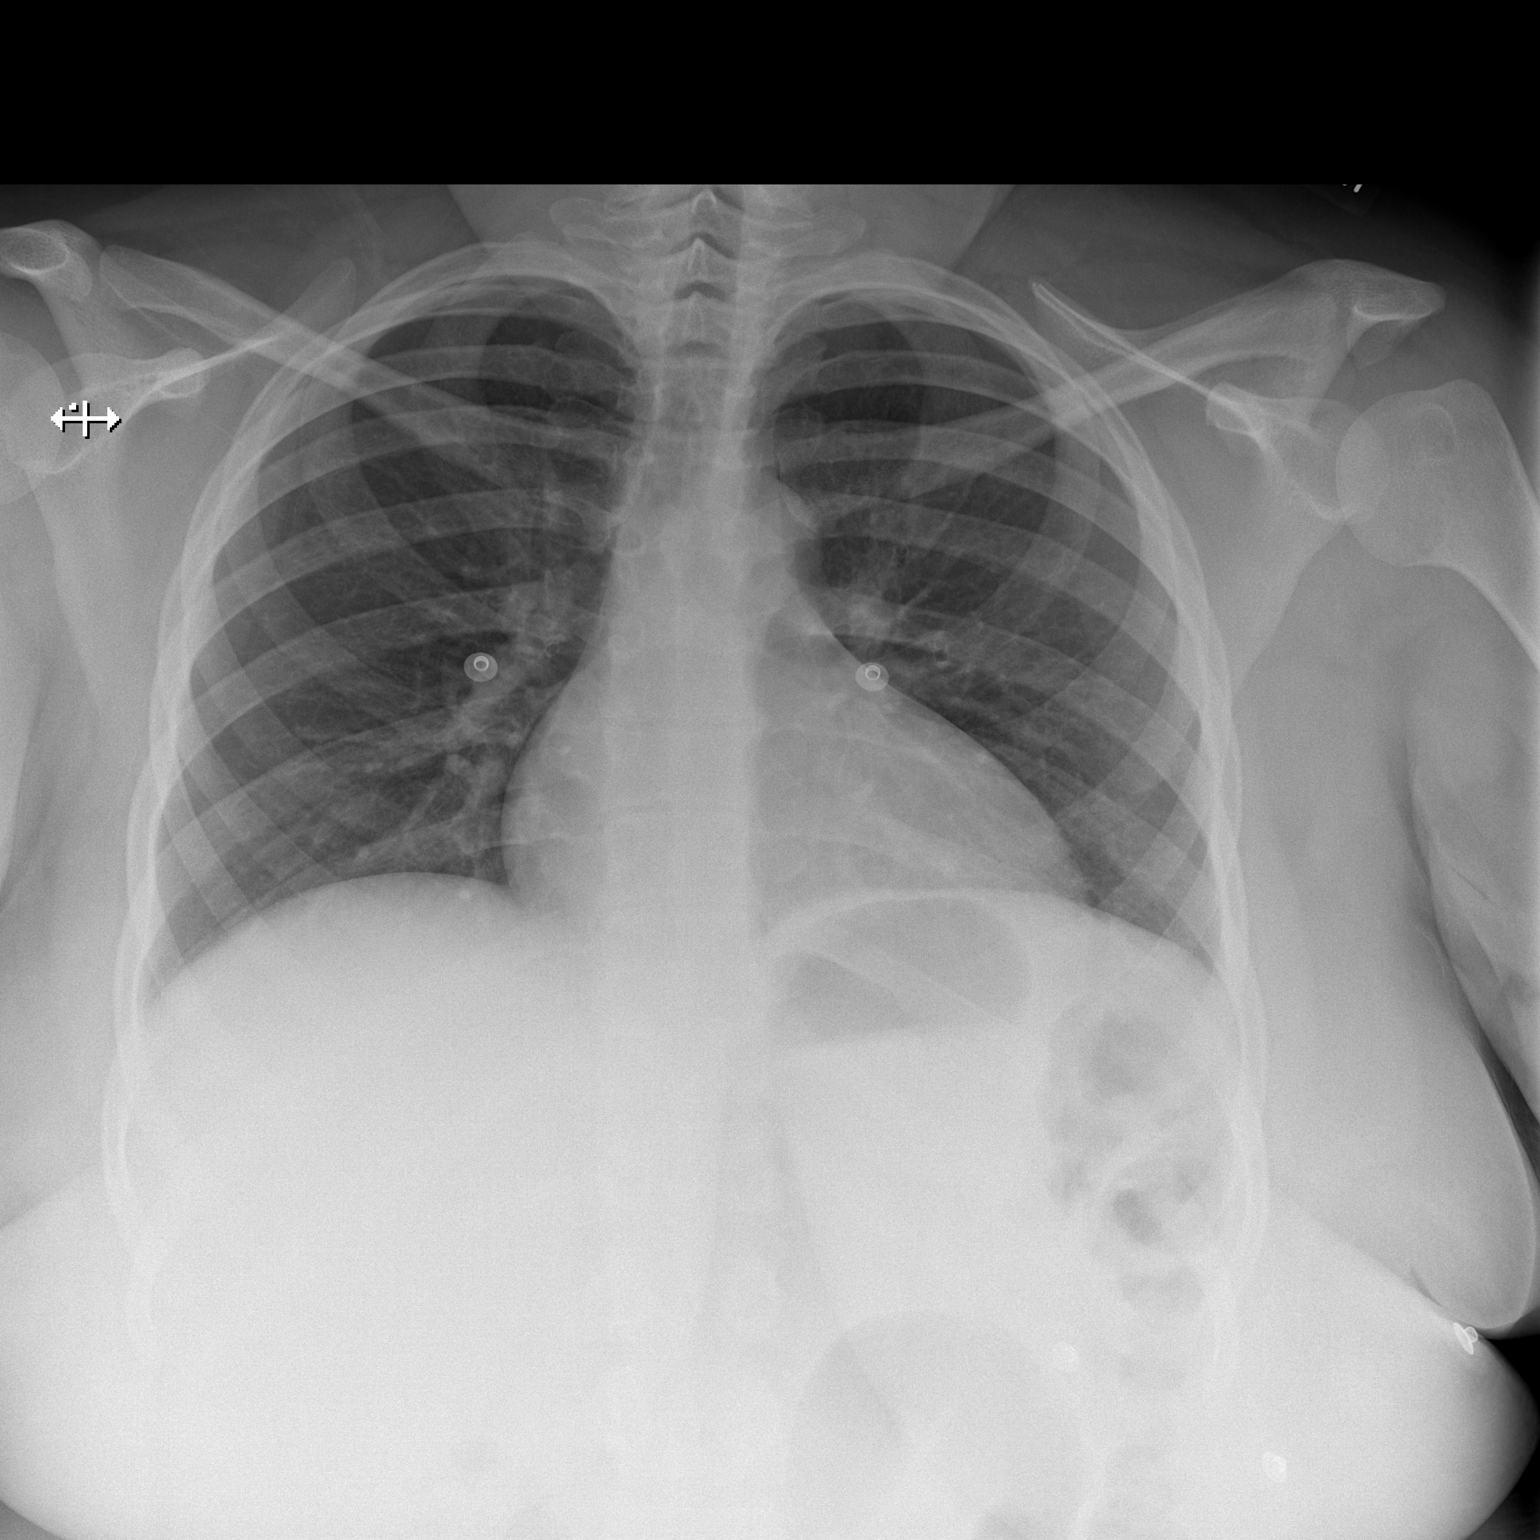

[w chest lat]
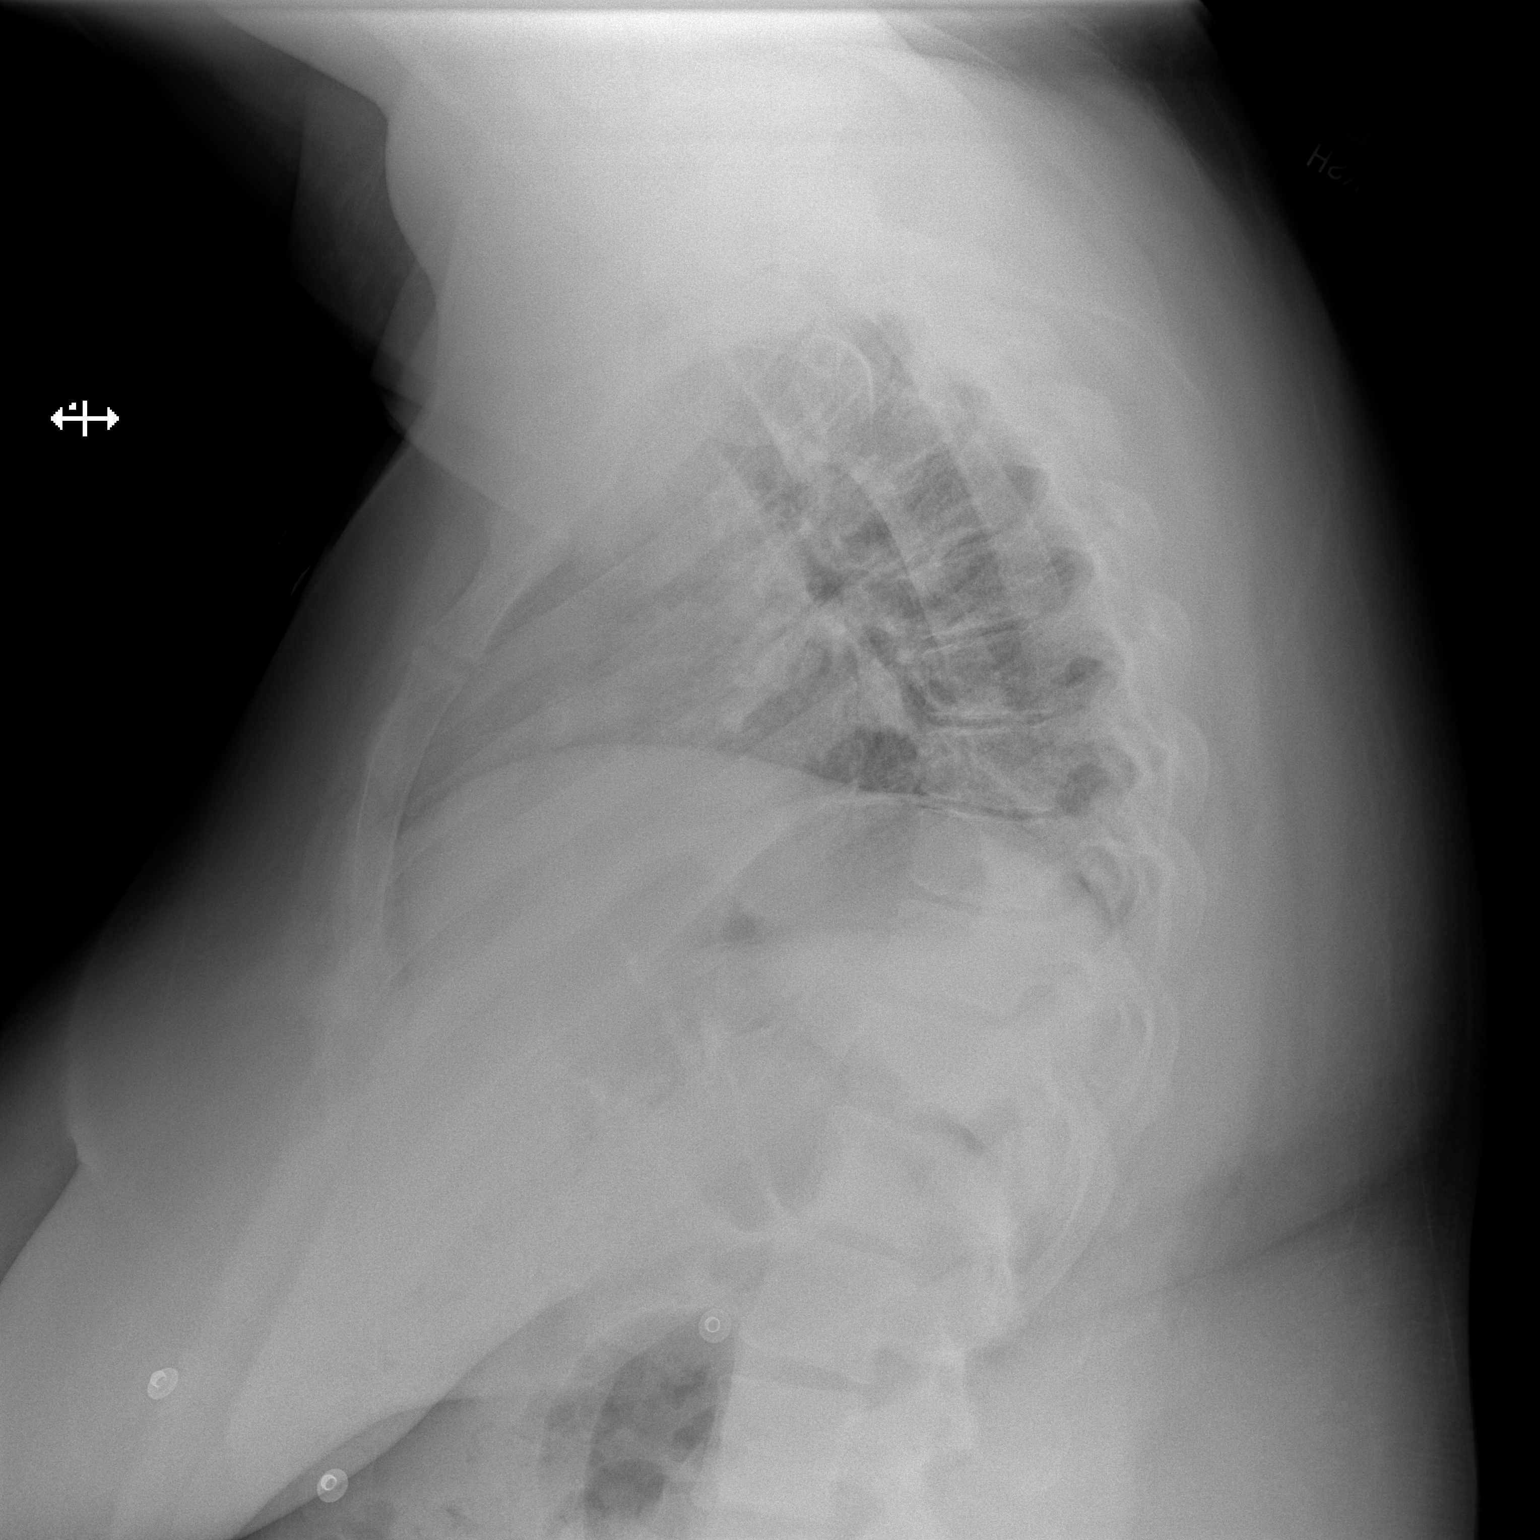

[2 of 2 positions shown; findings below may reference images not displayed]

FINDINGS: Heart size within normal limits.

There is no appreciable airspace consolidation.

No evidence of pleural effusion or pneumothorax.

No acute bony abnormality identified.
IMPRESSION: No evidence of active cardiopulmonary disease.
